# Patient Record
Sex: Female | Born: 1995 | Hispanic: No | Marital: Married
Health system: Northeastern US, Academic
[De-identification: ages and names within clinical notes are randomized; demographics above are authoritative.]

## PROBLEM LIST (undated history)

## (undated) DIAGNOSIS — J309 Allergic rhinitis, unspecified: Secondary | ICD-10-CM

## (undated) DIAGNOSIS — L309 Dermatitis, unspecified: Secondary | ICD-10-CM

## (undated) DIAGNOSIS — R4589 Other symptoms and signs involving emotional state: Secondary | ICD-10-CM

## (undated) HISTORY — DX: Dermatitis, unspecified: L30.9

## (undated) HISTORY — DX: Other symptoms and signs involving emotional state: R45.89

## (undated) HISTORY — DX: Allergic rhinitis, unspecified: J30.9

---
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
## 2013-03-26 ENCOUNTER — Ambulatory Visit (INDEPENDENT_AMBULATORY_CARE_PROVIDER_SITE_OTHER): Payer: Medicaid Other | Admitting: Family Medicine

## 2013-03-26 ENCOUNTER — Encounter: Payer: Self-pay | Admitting: Family Medicine

## 2013-03-26 VITALS — BP 124/86 | HR 98 | Temp 97.9°F | Ht 61.5 in | Wt 153.0 lb

## 2013-03-26 DIAGNOSIS — N912 Amenorrhea, unspecified: Secondary | ICD-10-CM

## 2013-03-26 DIAGNOSIS — E663 Overweight: Secondary | ICD-10-CM

## 2013-03-26 DIAGNOSIS — N926 Irregular menstruation, unspecified: Secondary | ICD-10-CM

## 2013-03-26 DIAGNOSIS — Z6825 Body mass index (BMI) 25.0-25.9, adult: Secondary | ICD-10-CM

## 2013-03-26 LAB — TSH: TSH: 1.905 u[IU]/mL (ref 0.350–4.500)

## 2013-03-26 LAB — FOLLICLE STIMULATING HORMONE: FSH: 6.6 m[IU]/mL

## 2013-03-26 LAB — PROLACTIN: Prolactin: 11.3 ng/mL

## 2013-03-26 NOTE — Patient Instructions (Addendum)
Secondary Amenorrhea   Secondary amenorrhea is the stopping of menstrual flow for 3 to 6 months in a female who has previously had periods. There are many possible causes. Most of these causes are not serious. Usually treating the underlying problem causing the loss of menses will return your periods to normal.  CAUSES   Some common and uncommon causes of not menstruating include:  · Malnutrition.  · Low blood sugar (hypoglycemia).  · Polycystic ovarian disease.  · Stress or fear.  · Breastfeeding.  · Hormone imbalance.  · Ovarian failure.  · Medications.  · Extreme obesity.  · Cystic fibrosis.  · Low body weight or drastic weight reduction from any cause.  · Early menopause.  · Removal of ovaries or uterus.  · Contraceptives.  · Illness.  · Long term (chronic) illnesses.  · Cushing's syndrome.  · Thyroid problems.  · Birth control pills, patches, or vaginal rings for birth control.  DIAGNOSIS   This diagnosis is made by your caregiver taking a medical history and doing a physical exam. Pregnancy must be ruled out. Often times, numerous blood tests of different hormones in the body may be measured. Urine testing may be done. Specialized x-rays may have to be done as well as measuring the body mass index (BMI).  TREATMENT   Treatment depends on the cause of the amenorrhea. If an eating disorder is present, this can be treated with an adequate diet and therapy. Chronic illnesses may improve with treatment of the illness. Overall, the outlook is good. The amenorrhea may be corrected with medications, lifestyle changes, or surgery. If the amenorrhea cannot be corrected, it is sometimes possible to create a false menstruation with medications.  Document Released: 01/21/2007 Document Revised: 03/03/2012 Document Reviewed: 11/28/2007  ExitCare® Patient Information ©2013 [employer].

## 2013-03-26 NOTE — Progress Notes (Signed)
Family Medicine Office Visit Note   Subjective:   Patient ID: Sierra Jennings, female  DOB: [birth year], 17 y.o.. MRN: [id number]   Pt that comes today with the complaint of a menorrhea. Her menarche happened in 2007 and since then she has been having irregular menses. Her periods are described to be normal flow and up to 7 days. The frequency for the past year was: January, Feb, March then June, July, August- December. She has had no period since then (4 months). She denies sexual activity.  He also has noticed increase in her weight, and increase hair distribution on her upper lip which patient shaves.  Patient denies abdominal pain or other symptoms.  Review of Systems:  Per history of present illness.  Objective:   Physical Exam: Gen:  NAD HEENT: Moist mucous membranes CV: Regular rate and rhythm, no murmurs rubs or gallops Breast: no galactorrhea.  PULM: Clear to auscultation bilaterally. No wheezes/rales/rhonchi ABD: Soft, non tender, non distended, normal bowel sounds EXT: No edema Neuro: Alert and oriented x3. No focalization SKIN: shaved upper lip, shave 2 pubic axillary hair. No rashes no istriae, acanthosis nigricans, vitiligo, and easy bruisability.  Assessment & Plan:

## 2013-03-26 NOTE — Assessment & Plan Note (Signed)
Increase of 9 pounds in 10 months. BMI and 28.4. Patient denies any eating disorder or eating any different that she was before.  Plan: Will discuss after results for amenorrhea workup is back. Given to patient general guidelines about healthy nutritional choices.

## 2013-03-26 NOTE — Assessment & Plan Note (Addendum)
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 Patient denies sexual activity. Physical exam positive for hirsutism and overweight. Plan: Urinary pregnancy test, and restiform oral test shown above. Followup in 2 weeks to discuss results.

## 2013-03-27 LAB — DHEA-SULFATE: DHEA-SO4: 393 ug/dL (ref 35–430)

## 2013-07-16 ENCOUNTER — Ambulatory Visit: Payer: Self-pay

## 2014-06-04 ENCOUNTER — Ambulatory Visit (INDEPENDENT_AMBULATORY_CARE_PROVIDER_SITE_OTHER): Payer: Medicaid Other | Admitting: Family Medicine

## 2014-06-04 ENCOUNTER — Encounter: Payer: Self-pay | Admitting: Family Medicine

## 2014-06-04 VITALS — BP 114/77 | HR 111 | Temp 98.7°F | Wt 150.0 lb

## 2014-06-04 DIAGNOSIS — A084 Viral intestinal infection, unspecified: Secondary | ICD-10-CM | POA: Insufficient documentation

## 2014-06-04 DIAGNOSIS — A088 Other specified intestinal infections: Secondary | ICD-10-CM

## 2014-06-04 NOTE — Assessment & Plan Note (Signed)
A:  Gastroenteritis  Resolved except for mild sore throat and headache lingering.  Afebrile.  P:   Supportive care with tylenol, motrin, fluids.

## 2014-06-04 NOTE — Progress Notes (Signed)
Patient ID: Sierra Jennings, female   DOB: 1996-07-31, 18 y.o.   MRN: [id number] Subjective:     Sierra Hammanbiha Rico is a 18 y.o. female who presents for evaluation of nausea and vomiting. Onset of symptoms was 3 days ago. Her family was visiting [place] in [place].  Patient describes nausea as severe. Vomiting has occurred several times over the past 2 days. Vomitus is described as normal gastric contents. Symptoms have been associated with fever to 102, sore throat, headache and  moderate abdominal pain. Patient denies alcohol overuse, hematemesis, melena and possibility of pregnancy. Symptoms have gradually improved. Her 20 yo sister started with similar symptoms today. Evaluation to date has been none. Treatment to date has been tylenol and motrin at home. Last took antipyretic last night. Last emesis yesterday during the day. .   Soc hx: non smoker.  Review of Systems Pertinent items are noted in HPI.   Objective:    BP 114/77  Pulse 111  Temp(Src) 98.7 F (37.1 C) (Oral)  Wt 150 lb (68.04 kg)  LMP 03/04/2014 General appearance: alert, cooperative and no distress Head: Normocephalic, without obvious abnormality, atraumatic Eyes: conjunctivae/corneas clear. PERRL, EOM's intact.  Ears: normal TM's and external ear canals both ears Nose: Nares normal. Septum midline. Mucosa normal. No drainage or sinus tenderness. Throat: lips, mucosa, and tongue normal; teeth and gums normal Lungs: clear to auscultation bilaterally Heart: regular rate and rhythm, S1, S2 normal, no murmur, click, rub or gallop Abdomen: soft, non-tender; bowel sounds normal; no masses,  no organomegaly Skin: Skin color, texture, turgor normal. No rashes or lesions   Assessment:    Gastroenteritis  Resolved except for mild sore throat and headache lingering.  Afebrile.   Plan:      Supportive care with tylenol, motrin, fluids.

## 2014-06-04 NOTE — Patient Instructions (Signed)
November,  Thank you for coming in today. You had what sounds like a viral GI illness which is getting better. Continue to drink plenty of fluids, advance your diet to regular foods slowly. If fever or vomiting recurs start tylenol and motrin. Call in for antiemetic (nause medication).   Dr. Armen PickupFunches   Viral Gastroenteritis Viral gastroenteritis is also called stomach flu. This illness is caused by a certain type of germ (virus). It can cause sudden watery poop (diarrhea) and throwing up (vomiting). This can cause you to lose body fluids (dehydration). This illness usually lasts for 3 to 8 days. It usually goes away on its own. HOME CARE   Drink enough fluids to keep your pee (urine) clear or pale yellow. Drink small amounts of fluids often.  Ask your doctor how to replace body fluid losses (rehydration).  Avoid:  Foods high in sugar.  Alcohol.  Bubbly (carbonated) drinks.  Tobacco.  Juice.  Caffeine drinks.  Very hot or cold fluids.  Fatty, greasy foods.  Eating too much at one time.  Dairy products until 24 to 48 hours after your watery poop stops.  You may eat foods with active cultures (probiotics). They can be found in some yogurts and supplements.  Wash your hands well to avoid spreading the illness.  Only take medicines as told by your doctor. Do not give aspirin to children. Do not take medicines for watery poop (antidiarrheals).  Ask your doctor if you should keep taking your regular medicines.  Keep all doctor visits as told. GET HELP RIGHT AWAY IF:   You cannot keep fluids down.  You do not pee at least once every 6 to 8 hours.  You are short of breath.  You see blood in your poop or throw up. This may look like coffee grounds.  You have belly (abdominal) pain that gets worse or is just in one small spot (localized).  You keep throwing up or having watery poop.  You have a fever.  The patient is a child younger than 3 months, and he or she has a  fever.  The patient is a child older than 3 months, and he or she has a fever and problems that do not go away.  The patient is a child older than 3 months, and he or she has a fever and problems that suddenly get worse.  The patient is a baby, and he or she has no tears when crying. MAKE SURE YOU:   Understand these instructions.  Will watch your condition.  Will get help right away if you are not doing well or get worse. Document Released: 05/28/2008 Document Revised: 03/03/2012 Document Reviewed: 09/26/2011 [facility] Patient Information 2014 [place].

## 2015-03-28 ENCOUNTER — Ambulatory Visit (INDEPENDENT_AMBULATORY_CARE_PROVIDER_SITE_OTHER): Payer: Medicaid Other | Admitting: Family Medicine

## 2015-03-28 ENCOUNTER — Encounter: Payer: Self-pay | Admitting: Family Medicine

## 2015-03-28 VITALS — BP 107/72 | HR 81 | Temp 97.8°F | Ht 61.56 in | Wt 151.7 lb

## 2015-03-28 DIAGNOSIS — K130 Diseases of lips: Secondary | ICD-10-CM | POA: Insufficient documentation

## 2015-03-28 DIAGNOSIS — K1379 Other lesions of oral mucosa: Secondary | ICD-10-CM

## 2015-03-28 NOTE — Assessment & Plan Note (Signed)
Small amount of material expressed today. Expect this will resolve without further intervention. advised pt f/u if worsening or not improving in next 1-2 mos.

## 2015-03-28 NOTE — Patient Instructions (Signed)
  Hopefully the mucocele will go away on its own We got some of the mucous out today This is a benign issue which should get better with time  Return if worsening Be well, Dr. Pollie MeyerMcIntyre

## 2015-03-28 NOTE — Progress Notes (Signed)
Patient ID: Sierra Jennings, female   DOB: 12-03-1996, 19 y.o.   MRN: [id number]  HPI:  Pt presents for a same day appointment to discuss bump on bottom lip.  Has had this for about 2 months. Has been getting bigger, occasionally shrinks in size a little. No trauma preceding the bump. Has not drained pus or blood. No other bumps elsewhere in her body. Does not really hurt at all. Does occasionally get in the way when she chews it. Eating and drinking normally.   ROS: See HPI  PMFSH: irregular menses, eczema, overweight. Pt denies any hx of congenital heart disease or need for antibiotic prophylaxis prior to dental procedures.  PHYSICAL EXAM: BP 107/72 mmHg  Pulse 81  Temp(Src) 97.8 F (36.6 C) (Oral)  Ht 5' 1.56" (1.564 m)  Wt 151 lb 11.2 oz (68.811 kg)  BMI 28.13 kg/m2  LMP 03/07/2015 Gen: NAD HEENT: NCAT, MMM, oropharynx clear and moist. Lower L lip with 0.5cm mucocele on inner aspect of lip. Soft, mildly fluctuant, nontender to palpation. No other oral lesions seen.   PROCEDURE NOTE: After informed consent was contained, a small 30g needle was used to puncture mucocele on L lower lip. Small amount of slightly sanguinous mucous material spontaneously expressed, with additional mucous material following with squeezing of mucocele. No complications, patient tolerated the procedure well.  ASSESSMENT/PLAN:  Mucocele of lower lip Small amount of material expressed today. Expect this will resolve without further intervention. advised pt f/u if worsening or not improving in next 1-2 mos.    FOLLOW UP: F/u as needed if symptoms worsen or do not improve.   [place]renadaBrittany J. Pollie MeyerMcIntyre, MD [facility]

## 2015-03-30 NOTE — Progress Notes (Signed)
I was available as preceptor to resident for this patient's office visit.  

## 2016-03-26 ENCOUNTER — Encounter: Payer: Medicaid Other | Admitting: Family Medicine

## 2016-04-13 ENCOUNTER — Encounter: Payer: Medicaid Other | Admitting: Family Medicine

## 2016-04-27 ENCOUNTER — Encounter: Payer: Medicaid Other | Admitting: Family Medicine

## 2016-12-19 NOTE — Progress Notes (Signed)
Subjective:  Sierra Jennings is a 20 y.o. year old female who presents to office today for an annual physical examination.  Concerns today include:   1. Sneezes: Has been sneezing "for years". It is intermittent. She also has runny nose. Has never had sinus infections. Can happen at any season. Admits to watery and itchy eyes. Knows that she is allergic to pollen. Denies any fevers, chills, shortness of breath, wheezing, chest pain.  2. Eczema: Patient states that she has eczema. She has had eczema for many years now. She uses her father's cream for eczema but does not have the name of it. She notes that her eczema usually goes away when she uses this cream that she has not used it recently. Her eczema spots are usually itchy and sometimes bleed from itching him some much.  Review of Systems Pertinent items noted in HPI and remainder of comprehensive ROS otherwise negative.    General Healthcare: Medication Compliance: Does not take any medications  Dx Hypertension: No Dx Hyperlipidemia: No Diabetes: No Dx Obesity: No Weight Loss: No Physical Activity: Started going to the gym about 3-4 times a week Urinary Incontinence: No  Social:  reports that she has never smoked. She does not have any smokeless tobacco history on file. Driving: She does drive a car and wear a seatbelt Alcohol Use: No Tobacco: No  Other Drugs: No  Support and Life at Home: Good support, lives with parents and 3 siblings  Advanced Directives: No  Cancer:  Colorectal >> Colonoscopy: N/A Lung >> Tobacco Use: None  Breast >> Mammogram: N/A Cervical/Endometrial >>  - Postmenopausal: No - Hysterectomy: No - Vaginal Bleeding: No Skin >> Suspicious lesions: no  Other: Osteoporosis: No  Zoster Vaccine: No Pneumonia Vaccine: No  Is not sexually active  Gets regular periods, gets a period every month. LMP was 11/30.   Past Medical History Patient Active Problem List   Diagnosis Date Noted  . Encounter  for wellness examination in adult 12/21/2016  . Overweight (BMI 25.0-29.9) 03/26/2013  . ALLERGIC RHINITIS 05/20/2008  . ECZEMA 03/28/2007    Medications- reviewed and updated Current Outpatient Prescriptions  Medication Sig Dispense Refill  . cetirizine (ZYRTEC) 10 MG tablet Take 1 tablet (10 mg total) by mouth daily. Take regularly to prevent allergy symptoms. 30 tablet 5  . triamcinolone cream (KENALOG) 0.1 % Apply 1 application topically 2 (two) times daily. 30 g 5   No current facility-administered medications for this visit.     Objective: BP 96/60   Pulse 80   Temp 97.9 F (36.6 C) (Oral)   Ht 5\' 2"  (1.575 m)   Wt 150 lb 3.2 oz (68.1 kg)   LMP 11/22/2016 (Exact Date)   SpO2 98%   BMI 27.47 kg/m  Gen: In no acute distress, alert, cooperative with exam HEENT: NCAT, EOMI, PERRL CV: Regular rate and rhythm, normal S1/S2, no murmur Resp: Clear to auscultation bilaterally, no wheezes, non-labored Abd: Soft, Non Tender, Non Distended, bowel sounds present, no guarding or organomegaly Ext: No edema, warm and well perfused Skin: Dry erythematous papular rash on flexor surfaces of bilateral arms as well as right neck Neuro: Alert and oriented, No gross deficits, normal gait  Assessment/Plan:  ECZEMA Uncontrolled. Rash present on flexor surfaces of bilateral arms as well as right-sided neck. Discussed basic skin care for eczema. Prescription for triamcinolone cream given to patient. Follow-up as needed.  ALLERGIC RHINITIS Uncontrolled symptoms. Prescription given to patient for Zyrtec 10 mg daily. Will  return to clinic in Zyrtec does not improve symptoms.   Encounter for wellness examination in adult Updated all medical history in Epic. Patient prefers not to have Pap smear. Does not want any vaccines at this time. We'll do basic lab work today per patient preference and follow-up in one year or sooner if needed.   Orders Placed This Encounter  Procedures  . CBC with  Differential  . TSH  . COMPLETE METABOLIC PANEL WITH GFR    Meds ordered this encounter  Medications  . triamcinolone cream (KENALOG) 0.1 %    Sig: Apply 1 application topically 2 (two) times daily.    Dispense:  30 g    Refill:  5  . DISCONTD: cetirizine (ZYRTEC) 10 MG tablet    Sig: Take 1 tablet (10 mg total) by mouth daily. Take regularly to prevent allergy symptoms.    Dispense:  30 tablet    Refill:  5  . cetirizine (ZYRTEC) 10 MG tablet    Sig: Take 1 tablet (10 mg total) by mouth daily. Take regularly to prevent allergy symptoms.    Dispense:  30 tablet    Refill:  5     Anders Simmondshristina Gambino, MD [facility], PGY-2

## 2016-12-21 ENCOUNTER — Encounter: Payer: Self-pay | Admitting: Family Medicine

## 2016-12-21 ENCOUNTER — Ambulatory Visit (INDEPENDENT_AMBULATORY_CARE_PROVIDER_SITE_OTHER): Payer: Medicaid Other | Admitting: Family Medicine

## 2016-12-21 VITALS — BP 96/60 | HR 80 | Temp 97.9°F | Ht 62.0 in | Wt 150.2 lb

## 2016-12-21 DIAGNOSIS — Z0000 Encounter for general adult medical examination without abnormal findings: Secondary | ICD-10-CM | POA: Insufficient documentation

## 2016-12-21 LAB — COMPLETE METABOLIC PANEL WITH GFR
ALBUMIN: 4.4 g/dL (ref 3.6–5.1)
ALT: 16 U/L (ref 6–29)
AST: 19 U/L (ref 10–30)
Alkaline Phosphatase: 84 U/L (ref 33–115)
BUN: 9 mg/dL (ref 7–25)
CHLORIDE: 104 mmol/L (ref 98–110)
CO2: 27 mmol/L (ref 20–31)
Calcium: 9.6 mg/dL (ref 8.6–10.2)
Creat: 0.63 mg/dL (ref 0.50–1.10)
GFR, Est African American: >89 mL/min (ref >=60)
GFR, Est Non African American: >89 mL/min (ref >=60)
GLUCOSE: 77 mg/dL (ref 65–99)
POTASSIUM: 4.4 mmol/L (ref 3.5–5.3)
SODIUM: 140 mmol/L (ref 135–146)
Total Bilirubin: 0.7 mg/dL (ref 0.2–1.2)
Total Protein: 7.5 g/dL (ref 6.1–8.1)

## 2016-12-21 LAB — CBC WITH DIFFERENTIAL/PLATELET
BASOS PCT: 1 %
Basophils Absolute: 63 cells/uL (ref 0–200)
Eosinophils Absolute: 441 cells/uL (ref 15–500)
Eosinophils Relative: 7 %
HEMATOCRIT: 41.5 % (ref 35.0–45.0)
Hemoglobin: 13.8 g/dL (ref 11.7–15.5)
LYMPHS ABS: 1260 {cells}/uL (ref 850–3900)
LYMPHS PCT: 20 %
MCH: 31 pg (ref 27.0–33.0)
MCHC: 33.3 g/dL (ref 32.0–36.0)
MCV: 93.3 fL (ref 80.0–100.0)
MONO ABS: 441 {cells}/uL (ref 200–950)
MPV: 11.1 fL (ref 7.5–12.5)
Monocytes Relative: 7 %
NEUTROS ABS: 4095 {cells}/uL (ref 1500–7800)
Neutrophils Relative %: 65 %
PLATELETS: 292 10*3/uL (ref 140–400)
RBC: 4.45 MIL/uL (ref 3.80–5.10)
RDW: 12.9 % (ref 11.0–15.0)
WBC: 6.3 10*3/uL (ref 3.8–10.8)

## 2016-12-21 MED ORDER — CETIRIZINE HCL 10 MG PO TABS: 10.0000 mg | ORAL_TABLET | Freq: Every day | ORAL | 5 refills | Status: DC

## 2016-12-21 MED ORDER — CETIRIZINE HCL 10 MG PO TABS: 10.0000 mg | ORAL_TABLET | Freq: Every day | ORAL | 5 refills | Status: AC

## 2016-12-21 MED ORDER — TRIAMCINOLONE ACETONIDE 0.1 % EX CREA: 1.0000 "application " | TOPICAL_CREAM | Freq: Two times a day (BID) | CUTANEOUS | 5 refills | Status: AC

## 2016-12-21 NOTE — Assessment & Plan Note (Signed)
Uncontrolled. Rash present on flexor surfaces of bilateral arms as well as right-sided neck. Discussed basic skin care for eczema. Prescription for triamcinolone cream given to patient. Follow-up as needed.

## 2016-12-21 NOTE — Assessment & Plan Note (Signed)
Uncontrolled symptoms. Prescription given to patient for Zyrtec 10 mg daily. Will return to clinic in Zyrtec does not improve symptoms.

## 2016-12-21 NOTE — Patient Instructions (Signed)
Thank you for coming in today, it was so nice to see you! Today we talked about:    Sneezing: I think you have seasonal allergies. Please take zyrtec every day  Eczema: Please use the cream as prescribed.  Please follow up in 1 year or sooner if needed.   If we ordered any tests today, you will be notified via telephone of any abnormalities. If everything is normal you will get a letter in the mail.   If you have any questions or concerns, please do not hesitate to call the office at [phone]. You can also message me directly via MyChart.   Sincerely,  Anders Simmondshristina Welton Bord, MD   Basic Skin Care Your child's skin plays an important role in keeping the entire body healthy.  Below are some tips on how to try and maximize skin health from the outside in.  1) Bathe in mildly warm water every 1 to 3 days, followed by light drying and an application of a thick moisturizer cream or ointment, preferably one that comes in a tub. a. Fragrance free moisturizing bars or body washes are preferred such as Purpose, Cetaphil, Dove sensitive skin, Aveeno, [employer] Baby or Vanicream products. b. Use a fragrance free cream or ointment, not a lotion, such as plain petroleum jelly or Vaseline ointment, Aquaphor, Vanicream, Eucerin cream or a generic version, CeraVe Cream, Cetaphil Restoraderm, Aveeno Eczema Therapy and [employer] Baby Calming, among others. c. Children with very dry skin often need to put on these creams two, three or four times a day.  As much as possible, use these creams enough to keep the skin from looking dry. d. Consider using fragrance free/dye free detergent, such as Arm and Hammer for sensitive skin, Tide Free or All Free.   2) If I am prescribing a medication to go on the skin, the medicine goes on first to the areas that need it, followed by a thick cream as above to the entire body.  3) Wynelle LinkSun is a major cause of damage to the skin. a. I recommend sun protection for all of my  patients. I prefer physical barriers such as hats with wide brims that cover the ears, long sleeve clothing with SPF protection including rash guards for swimming. These can be found seasonally at outdoor clothing companies, [employer] and [employer] and online at Liz Claibornewww.coolibar.com, www.uvskinz.com and BrideEmporium.nlwww.sunprecautions.com. Avoid peak sun between the hours of 10am to 3pm to minimize sun exposure.  b. I recommend sunscreen for all of my patients older than 126 months of age when in the sun, preferably with broad spectrum coverage and SPF 30 or higher.  i. For children, I recommend sunscreens that only contain titanium dioxide and/or zinc oxide in the active ingredients. These do not burn the eyes and appear to be safer than chemical sunscreens. These sunscreens include zinc oxide paste found in the diaper section, Vanicream Broad Spectrum 50+, Aveeno Natural Mineral Protection, Neutrogena Pure and Free Baby, Johnson and MotorolaJohnson Baby Daily face and body lotion, Cit[employer] Baby products, among others. ii. There is no such thing as waterproof sunscreen. All sunscreens should be reapplied after 60-80 minutes of wear.  iii. Spray on sunscreens often use chemical sunscreens which do protect against the sun. However, these can be difficult to apply correctly, especially if wind is present, and can be more likely to irritate the skin.  Long term effects of chemical sunscreens are also not fully known.

## 2016-12-21 NOTE — Assessment & Plan Note (Signed)
Updated all medical history in Epic. Patient prefers not to have Pap smear. Does not want any vaccines at this time. We'll do basic lab work today per patient preference and follow-up in one year or sooner if needed.

## 2016-12-22 ENCOUNTER — Encounter: Payer: Self-pay | Admitting: Family Medicine

## 2016-12-22 LAB — TSH: TSH: 1.05 mIU/L

## 2016-12-23 ENCOUNTER — Encounter: Payer: Self-pay | Admitting: Family Medicine

## 2017-06-17 ENCOUNTER — Ambulatory Visit: Payer: Medicaid Other

## 2018-04-09 ENCOUNTER — Ambulatory Visit: Payer: Self-pay

## 2018-10-10 ENCOUNTER — Ambulatory Visit (INDEPENDENT_AMBULATORY_CARE_PROVIDER_SITE_OTHER): Payer: Self-pay | Admitting: Family Medicine

## 2018-10-10 ENCOUNTER — Other Ambulatory Visit: Payer: Self-pay

## 2018-10-10 ENCOUNTER — Telehealth: Payer: Self-pay | Admitting: *Deleted

## 2018-10-10 VITALS — BP 98/72 | HR 74 | Temp 98.8°F | Ht 62.0 in | Wt 164.0 lb

## 2018-10-10 DIAGNOSIS — N644 Mastodynia: Secondary | ICD-10-CM

## 2018-10-10 NOTE — Progress Notes (Signed)
   Subjective:   Patient ID: Sierra Jennings    DOB: [birth year], 22 y.o. female   MRN: [id number]  CC: breast pain  HPI: Sierra Jennings is a 22 y.o. female who presents to clinic today for the following issue.  Breast pain Patient reports pain in bilateral breasts over the last 2 to 3 years, usually right side more often than the left.  She reports small white bumps that pop up and bleed with small amount of white fluid coming out of them.  They usually subside on their own.  She has not tried any medications or treatment for this.  There is no correlation with her period.  No fever, chills, nausea, vomiting.  No nipple fluid or discharge.  No family history of breast cancer.  She is nulliparous and not sexually active.  ROS: No fever, chills, nausea, vomiting.  No chest pain, shortness of breath.  Social: pt is a never smoker.  Medications reviewed. Objective:   BP 98/72   Pulse 74   Temp 98.8 F (37.1 C) (Oral)   Ht 5\' 2"  (1.575 m)   Wt 164 lb (74.4 kg)   LMP 09/24/2018 (Approximate)   SpO2 99%   BMI 30.00 kg/m  Vitals and nursing note reviewed.  General: 22 year old female, NAD Neck: supple CV: RRR no MRG  Lungs: CTAB, normal effort  Abdomen: soft, +bs  Breasts: breasts appear normal, no suspicious masses, no skin or nipple changes or axillary nodes bilaterally.  Skin: warm, dry, no rash  Assessment & Plan:   Breast pain, right Normal breast exam today without evidence of cyst or nodes.  No red flags.  No family history of breast cancer. Reassurance provided, recommend that she take a photograph next time this occurs to bring and show provider to better delineate what this may be.  Patient desires diagnostic mammogram of  breast for further evaluation, have placed order for this.  Orders Placed This Encounter  Procedures  . MM Digital Diagnostic Unilat R    Standing Status:   Future    Standing Expiration Date:   12/11/2019    Order Specific Question:   Reason for Exam  (SYMPTOM  OR DIAGNOSIS REQUIRED)    Answer:   breast pain with discharge    Order Specific Question:   Is the patient pregnant?    Answer:   No    Order Specific Question:   Preferred imaging location?    Answer:   [facility]   Freddrick March, MD [facility], PGY-3

## 2018-10-10 NOTE — Patient Instructions (Signed)
It was nice meeting you today.  You were seen in clinic for right breast pain.  As we discussed, your breast exam today was normal.  It may help to take photographs of the lumps with fluid when they occur to bring in and show your provider.  I am sending you for a diagnostic right breast exam and will call you once the results come back.  Please call clinic if you have any questions.  Freddrick March MD

## 2018-10-10 NOTE — Assessment & Plan Note (Signed)
Normal breast exam today without evidence of cyst or nodes.  No red flags.  No family history of breast cancer. Reassurance provided, recommend that she take a photograph next time this occurs to bring and show provider to better delineate what this may be.  Patient desires diagnostic mammogram of  breast for further evaluation, have placed order for this.

## 2018-10-10 NOTE — Telephone Encounter (Signed)
Contacted [facility] to schedule diagnostic mammogram and was told that pt insurance would not cover this.  Was given number to speak to someone about getting this pt a diagnostic mammo through the scholarship program.  Called and left a message for someone to contact me to find out what our next steps are to get this completed.Lamonte Sakai, Dagen Beevers D, [place]

## 2019-06-19 ENCOUNTER — Ambulatory Visit (INDEPENDENT_AMBULATORY_CARE_PROVIDER_SITE_OTHER): Payer: Self-pay | Admitting: Family Medicine

## 2019-06-19 ENCOUNTER — Encounter: Payer: Self-pay | Admitting: Family Medicine

## 2019-06-19 ENCOUNTER — Other Ambulatory Visit: Payer: Self-pay

## 2019-06-19 VITALS — BP 100/75 | HR 72

## 2019-06-19 DIAGNOSIS — Z0000 Encounter for general adult medical examination without abnormal findings: Secondary | ICD-10-CM

## 2019-06-19 DIAGNOSIS — R4589 Other symptoms and signs involving emotional state: Secondary | ICD-10-CM | POA: Insufficient documentation

## 2019-06-19 DIAGNOSIS — Z113 Encounter for screening for infections with a predominantly sexual mode of transmission: Secondary | ICD-10-CM

## 2019-06-19 DIAGNOSIS — F329 Major depressive disorder, single episode, unspecified: Secondary | ICD-10-CM

## 2019-06-19 NOTE — Patient Instructions (Addendum)
   It was wonderful to see you today.  Thank you for choosing [facility].   Please call [phone] with any questions about today's appointment.  Please be sure to schedule follow up at the front  desk before you leave today.   Dorris Singh, MD  Family Medicine    You will receive a call from Ms. Laurance Flatten (Education officer, museum) about options for therapy  Below is the number for genetics counseling: regarding your concerns  [phone]---they can connect you with genetic counseling, if you need a referral, please call our clinic  Good luck with studying!!!
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
## 2019-06-20 LAB — HCV COMMENT:

## 2019-06-20 LAB — HEPATITIS C ANTIBODY (REFLEX): HCV Ab: <0.1 s/co ratio (ref 0.0–0.9)

## 2019-06-20 LAB — HIV ANTIBODY (ROUTINE TESTING W REFLEX): HIV Screen 4th Generation wRfx: NONREACTIVE

## 2019-06-22 ENCOUNTER — Telehealth: Payer: Self-pay | Admitting: Family Medicine

## 2019-06-22 ENCOUNTER — Encounter: Payer: Self-pay | Admitting: Family Medicine

## 2019-06-22 NOTE — Telephone Encounter (Signed)
Pt returned call.  Informed. Christen Bame, CMA

## 2019-06-22 NOTE — Telephone Encounter (Signed)
Called patient, left generic voicemail to call back.  Results returned all normal--this is good news.   Dorris Singh, MD  Family Medicine Teaching Service

## 2019-06-22 NOTE — Progress Notes (Signed)
Sent normal letter results.

## 2019-06-25 ENCOUNTER — Telehealth: Payer: Self-pay | Admitting: Licensed Clinical Social Worker

## 2019-06-25 NOTE — Telephone Encounter (Signed)
    Phone Outreach Note  06/25/2019 Name: Sierra Jennings MRN: [id number] DOB: 1996-08-08  Referred by: Martyn Malay, MD Reason for referral : Care Coordination (therapy )  telephone outreach to patient reference the above referral. Phone appointment scheduled July 7th at 3:00 for assessment and plan.  Casimer Lanius, LCSW [facility]   [phone] 10:16 AM

## 2019-06-30 ENCOUNTER — Telehealth: Payer: Self-pay | Admitting: Licensed Clinical Social Worker

## 2019-06-30 ENCOUNTER — Institutional Professional Consult (permissible substitution): Payer: Medicaid Other

## 2019-06-30 ENCOUNTER — Other Ambulatory Visit: Payer: Self-pay

## 2019-06-30 NOTE — Telephone Encounter (Signed)
   Unsuccessful Phone Appointment  06/30/2019 Name: Sierra Jennings MRN: [id number] DOB: [birth year]  Referred by: Martyn Malay, MD Reason for referral : Care Coordination (counseling resources)  Phone appointment scheduled with patient to provide and review counseling resource options.  No answer. An unsuccessful telephone outreach to patient was attempted today reference the above referral.  Left voice message to call LCSW it patient would like to review and discuss options  Follow Up Plan:  LCSW will wait for return call.  Casimer Lanius, [place]   [phone] 3:16 PM

## 2019-08-25 ENCOUNTER — Other Ambulatory Visit: Payer: Self-pay | Admitting: Emergency Medicine

## 2019-08-25 DIAGNOSIS — Z20822 Contact with and (suspected) exposure to covid-19: Secondary | ICD-10-CM

## 2019-08-27 LAB — NOVEL CORONAVIRUS, NAA: SARS-CoV-2, NAA: NOT DETECTED

## 2020-06-10 ENCOUNTER — Ambulatory Visit (INDEPENDENT_AMBULATORY_CARE_PROVIDER_SITE_OTHER): Payer: No Typology Code available for payment source | Admitting: Family Medicine

## 2020-06-10 ENCOUNTER — Other Ambulatory Visit: Payer: Self-pay

## 2020-06-10 ENCOUNTER — Ambulatory Visit: Payer: Medicaid Other | Admitting: Family Medicine

## 2020-06-10 ENCOUNTER — Encounter: Payer: Self-pay | Admitting: Family Medicine

## 2020-06-10 VITALS — BP 110/68 | HR 80 | Wt 187.0 lb

## 2020-06-10 DIAGNOSIS — N926 Irregular menstruation, unspecified: Secondary | ICD-10-CM

## 2020-06-10 LAB — POCT GLYCOSYLATED HEMOGLOBIN (HGB A1C): Hemoglobin A1C: 5.2 % (ref 4.0–5.6)

## 2020-06-10 MED ORDER — NORETHINDRONE 0.35 MG PO TABS: 1.0000 | ORAL_TABLET | Freq: Every day | ORAL | 11 refills | Status: DC

## 2020-06-10 NOTE — Progress Notes (Signed)
    SUBJECTIVE:   CHIEF COMPLAINT / HPI:   Irregular periods.  Always typically irregular, but mostly 30-35 days.  When she does bleed, usually does not have excessive bleeding. Excpet for three weeks ago had really heavy bleeding. Wears thick pads. Last time, bled through pad, underwear and pants. The time before that, had for twenty days.  Has also noticed weight gain.  Before getting married 155 and now is 180.  She tracks them on an app on her phone.  Most bothersome to her is the irregularity.  She does not plan on conceiving at this time. Is not against using birth control to help regulate her periods.   Dyspareunia that resolves after the first few minutes of intercourse.   PERTINENT  PMH / PSH: Hx of irregular periods since menarche   OBJECTIVE:   BP 110/68   Pulse 80   Wt 187 lb (84.8 kg)   LMP 05/17/2020 (Approximate)   SpO2 98%   BMI 34.20 kg/m   General: well appearing, NAD.  Mild pustular acne on cheeks and chin. No facial hair growth.  Pelvic exam: deferred  Husband present during exam today.   ASSESSMENT/PLAN:   Irregular periods Patient returning with complaints of irregular menses.  She has been seen for this issue multiple times previously. Given patient's obesity, this is likely adding to her issue. Recommend weight loss which patient is agreeable to. She has never had a pelvic exam previously due to pain and declines pelvic exam/bimanual exam today. I don't think she would tolerate a TVUS at this time. Menstrual irregularity, obesity and acne suspicious for PCOS. Will manage with POPs to see if this helps to regulate her menses. No history of blood clots, non-smoker. Patient instructed to return if she continues to have issues with dyspareunia. Follow up in 3 months. Also obtaining CBC and other metabolic labs today.  Patient should also take PNV given her age. If she plans on conceiving, she should let her medical providers know. She will continue to follow  with [place] at [facility] for OB/GYN care as her last OB/GYN was in [place]. Requesting records.    Melene Plan, MD [facility]

## 2020-06-10 NOTE — Patient Instructions (Addendum)
I am going to start a medication called Micronor.  This is a progesterone only birth control pill.  The purpose of this would be to help regulate your periods so that you are not having very heavy cycles or long periods between your cycles.  This medication will not help with menstrual cycle regularity immediately.  Typically, we anticipate about 3 to 6 months of menstrual irregularity prior to consistent cycles.  Please follow-up with [place] in about 3 months to see how you are doing.  I do have a strong suspicion for PCOS.  I have attached more information about this below.  In the meantime, I would like you to work on nutrition and exercise to help with weight loss.  This could certainly help with the irregular menstrual cycles.   Polycystic Ovarian Syndrome  Polycystic ovarian syndrome (PCOS) is a common hormonal disorder among women of reproductive age. In most women with PCOS, many small fluid-filled sacs (cysts) grow on the ovaries, and the cysts are not part of a normal menstrual cycle. PCOS can cause problems with your menstrual periods and make it difficult to get pregnant. It can also cause an increased risk of miscarriage with pregnancy. If it is not treated, PCOS can lead to serious health problems, such as diabetes and heart disease. What are the causes? The cause of PCOS is not known, but it may be the result of a combination of certain factors, such as:  Irregular menstrual cycle.  High levels of certain hormones (androgens).  Problems with the hormone that helps to control blood sugar (insulin resistance).  Certain genes. What increases the risk? This condition is more likely to develop in women who have a family history of PCOS. What are the signs or symptoms? Symptoms of PCOS may include:  Multiple ovarian cysts.  Infrequent periods or no periods.  Periods that are too frequent or too heavy.  Unpredictable periods.  Inability to get pregnant (infertility) because of not  ovulating.  Increased growth of hair on the face, chest, stomach, back, thumbs, thighs, or toes.  Acne or oily skin. Acne may develop during adulthood, and it may not respond to treatment.  Pelvic pain.  Weight gain or obesity.  Patches of thickened and dark brown or black skin on the neck, arms, breasts, or thighs (acanthosis nigricans).  Excess hair growth on the face, chest, abdomen, or upper thighs (hirsutism). How is this diagnosed? This condition is diagnosed based on:  Your medical history.  A physical exam, including a pelvic exam. Your health care provider may look for areas of increased hair growth on your skin.  Tests, such as: ? Ultrasound. This may be used to examine the ovaries and the lining of the uterus (endometrium) for cysts. ? Blood tests. These may be used to check levels of sugar (glucose), female hormone (testosterone), and female hormones (estrogen and progesterone) in your blood. How is this treated? There is no cure for PCOS, but treatment can help to manage symptoms and prevent more health problems from developing. Treatment varies depending on:  Your symptoms.  Whether you want to have a baby or whether you need birth control (contraception). Treatment may include nutrition and lifestyle changes along with:  Progesterone hormone to start a menstrual period.  Birth control pills to help you have regular menstrual periods.  Medicines to make you ovulate, if you want to get pregnant.  Medicine to reduce excessive hair growth.  Surgery, in severe cases. This may involve making small holes  in one or both of your ovaries. This decreases the amount of testosterone that your body produces. Follow these instructions at home:  Take over-the-counter and prescription medicines only as told by your health care provider.  Follow a healthy meal plan. This can help you reduce the effects of PCOS. ? Eat a healthy diet that includes lean proteins, complex  carbohydrates, fresh fruits and vegetables, low-fat dairy products, and healthy fats. Make sure to eat enough fiber.  If you are overweight, lose weight as told by your health care provider. ? Losing 10% of your body weight may improve symptoms. ? Your health care provider can determine how much weight loss is best for you and can help you lose weight safely.  Keep all follow-up visits as told by your health care provider. This is important. Contact a health care provider if:  Your symptoms do not get better with medicine.  You develop new symptoms. This information is not intended to replace advice given to you by your health care provider. Make sure you discuss any questions you have with your health care provider. Document Revised: 11/22/2017 Document Reviewed: 05/27/2016 Elsevier Patient Education  2020 [employer] for Polycystic Ovary Syndrome Polycystic ovary syndrome (PCOS) is a disorder of the chemicals (hormones) that regulate a woman's reproductive system, including monthly periods (menstruation). The condition causes important hormones to be out of balance. PCOS can:  Stop your periods or make them irregular.  Cause cysts to develop on your ovaries.  Make it difficult to get pregnant.  Stop your body from responding to the effects of insulin (insulin resistance). Insulin resistance can lead to obesity and diabetes. Changing what you eat can help you manage PCOS and improve your health. Following a balanced diet can help you lose weight and improve the way that your body uses insulin. What are tips for following this plan?  Follow a balanced diet for meals and snacks. Eat breakfast, lunch, dinner, and one or two snacks every day.  Include protein in each meal and snack.  Choose whole grains instead of products that are made with refined flour.  Eat a variety of foods.  Exercise regularly as told by your health care provider. Aim to do 30 or more minutes of  exercise on most days of the week.  If you are overweight or obese: ? Pay attention to how many calories you eat. Cutting down on calories can help you lose weight. ? Work with your health care provider or a diet and nutrition specialist (dietitian) to figure out how many calories you need each day. What foods can I eat?  Fruits Include a variety of colors and types. All fruits are helpful for PCOS. Vegetables Include a variety of colors and types. All vegetables are helpful for PCOS. Grains Whole grains, such as whole wheat. Whole-grain breads, crackers, cereals, and pasta. Unsweetened oatmeal, bulgur, barley, quinoa, and brown rice. Tortillas made from corn or whole-wheat flour. Meats and other proteins Low-fat (lean) proteins, such as fish, chicken, beans, eggs, and tofu. Dairy Low-fat dairy products, such as skim milk, cheese sticks, and yogurt. Beverages Low-fat or fat-free drinks, such as water, low-fat milk, sugar-free drinks, and small amounts of 100% fruit juice. Seasonings and condiments Ketchup. Mustard. Barbecue sauce. Relish. Low-fat or fat-free mayonnaise. Fats and oils Olive oil or canola oil. Walnuts and almonds. The items listed above may not be a complete list of recommended foods and beverages. Contact a dietitian for more options. What foods are  not recommended? Foods that are high in calories or fat. Fried foods. Sweets. Products that are made from refined white flour, including white bread, pastries, white rice, and pasta. The items listed above may not be a complete list of foods and beverages to avoid. Contact a dietitian for more information. Summary  PCOS is a hormonal imbalance that affects a woman's reproductive system.  You can help to manage your PCOS by exercising regularly and eating a healthy, varied diet of vegetables, fruit, whole grains, low-fat (lean) protein, and low-fat dairy products.  Changing what you eat can improve the way that your body  uses insulin, help your hormones reach normal levels, and help you lose weight. This information is not intended to replace advice given to you by your health care provider. Make sure you discuss any questions you have with your health care provider. Document Revised: 04/01/2019 Document Reviewed: 10/14/2017 Elsevier Patient Education  2020 [employer].

## 2020-06-11 LAB — CBC
Hematocrit: 37.6 % (ref 34.0–46.6)
Hemoglobin: 12.5 g/dL (ref 11.1–15.9)
MCH: 28.7 pg (ref 26.6–33.0)
MCHC: 33.2 g/dL (ref 31.5–35.7)
MCV: 86 fL (ref 79–97)
Platelets: 331 10*3/uL (ref 150–450)
RBC: 4.36 x10E6/uL (ref 3.77–5.28)
RDW: 13.6 % (ref 11.7–15.4)
WBC: 6.3 10*3/uL (ref 3.4–10.8)

## 2020-06-11 LAB — COMPREHENSIVE METABOLIC PANEL
ALT: 36 IU/L — ABNORMAL HIGH (ref 0–32)
AST: 25 IU/L (ref 0–40)
Albumin/Globulin Ratio: 1.4 (ref 1.2–2.2)
Albumin: 4.1 g/dL (ref 3.9–5.0)
Alkaline Phosphatase: 123 IU/L — ABNORMAL HIGH (ref 48–121)
BUN/Creatinine Ratio: 12 (ref 9–23)
BUN: 8 mg/dL (ref 6–20)
Bilirubin Total: 0.3 mg/dL (ref 0.0–1.2)
CO2: 22 mmol/L (ref 20–29)
Calcium: 8.7 mg/dL (ref 8.7–10.2)
Chloride: 103 mmol/L (ref 96–106)
Creatinine, Ser: 0.67 mg/dL (ref 0.57–1.00)
GFR calc Af Amer: 140 mL/min/{1.73_m2} (ref >59)
GFR calc non Af Amer: 122 mL/min/{1.73_m2} (ref >59)
Globulin, Total: 3 g/dL (ref 1.5–4.5)
Glucose: 90 mg/dL (ref 65–99)
Potassium: 4.1 mmol/L (ref 3.5–5.2)
Sodium: 138 mmol/L (ref 134–144)
Total Protein: 7.1 g/dL (ref 6.0–8.5)

## 2020-06-11 LAB — TSH: TSH: 3.42 u[IU]/mL (ref 0.450–4.500)

## 2020-06-13 NOTE — Assessment & Plan Note (Addendum)
Patient returning with complaints of irregular menses.  She has been seen for this issue multiple times previously. Given patient's obesity, this is likely adding to her issue. Recommend weight loss which patient is agreeable to. She has never had a pelvic exam previously due to pain and declines pelvic exam/bimanual exam today. I don't think she would tolerate a TVUS at this time. Menstrual irregularity, obesity and acne suspicious for PCOS. Will manage with POPs to see if this helps to regulate her menses. No history of blood clots, non-smoker. Patient instructed to return if she continues to have issues with dyspareunia. Follow up in 3 months. Also obtaining CBC and other metabolic labs today.  Patient should also take PNV given her age. If she plans on conceiving, she should let her medical providers know. She will continue to follow with [place] at [facility] for OB/GYN care as her last OB/GYN was in [place]. Requesting records.

## 2020-06-15 ENCOUNTER — Encounter: Payer: Self-pay | Admitting: Family Medicine

## 2020-08-20 ENCOUNTER — Encounter: Payer: Self-pay | Admitting: Family Medicine

## 2020-08-22 MED ORDER — NORETHINDRONE 0.35 MG PO TABS: 1.0000 | ORAL_TABLET | Freq: Every day | ORAL | 11 refills | Status: AC

## 2022-05-29 ENCOUNTER — Encounter: Payer: Self-pay | Admitting: *Deleted

## 2022-11-13 ENCOUNTER — Inpatient Hospital Stay: Admit: 2022-11-13 | Discharge: 2022-11-13 | Payer: PRIVATE HEALTH INSURANCE

## 2022-12-12 ENCOUNTER — Ambulatory Visit: Admit: 2022-12-12 | Payer: PRIVATE HEALTH INSURANCE | Attending: Reproductive Endocrinology

## 2022-12-12 ENCOUNTER — Encounter: Admit: 2022-12-12 | Payer: PRIVATE HEALTH INSURANCE | Attending: Reproductive Endocrinology

## 2022-12-12 DIAGNOSIS — Q625 Duplication of ureter: Secondary | ICD-10-CM

## 2022-12-12 DIAGNOSIS — N2889 Other specified disorders of kidney and ureter: Secondary | ICD-10-CM

## 2022-12-12 DIAGNOSIS — N970 Female infertility associated with anovulation: Secondary | ICD-10-CM

## 2022-12-12 DIAGNOSIS — E282 Polycystic ovarian syndrome: Secondary | ICD-10-CM

## 2022-12-12 NOTE — Progress Notes
Pt seen in office by Dr. Smith Mince todayWrap up done in personLab slips given to coupleSA info given, TP submittedMRI ordered and contact info given to ptSupplement and vaginal dilators info given to ptCouple to make f/u once workup complete

## 2022-12-12 NOTE — Progress Notes
New Visit Referring ProviderBerrahou, Skeet Latch, [id number] [facility] [place],  [place] [id number]:  Jamie Townsend is a 26 y.o. female who presents for evaluation of infertility lasting  2 years.Notable aspects for today's visit include: 1. 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 The lower pole moiety demonstrates cortical scarring. No suspicious renal lesions, urothelial lesions or calculi. Hepatic steatosis.5. Iron deficiency - On supplement 6. History of vitamin D deficiency -Not taking any supplement7. 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 MedicationsCurrent Outpatient Medications: ?  metFORMIN, 500 mg, Oral, BID WC?  ZyrTEC, Take 10 mg by mouth.?  ferrous sulfate, 1 tablet, Oral, Every Other Day?  hydrocortisone, Apply topically 2 (two) times daily.?  medroxyPROGESTERone, 10 mg, Oral, Daily?  melatonin, Take by mouth.?  tacrolimus, Apply topically 2 (two) times daily.Social History reports that she has never smoked. She has never been exposed to tobacco smoke. She has never used smokeless tobacco. She reports that she does not drink alcohol and does not use drugs.Family History family history includes Breast cancer in her maternal grandmother; Lung cancer in her maternal uncle.GYN History:-Menarche at age 66 - 35 cycle day lengths -No abnormal PAP- Last Pap Smear on 09/2022-No STIs Obstetric HistoryOB History   Gravida 0  Para 0  Term 0  Preterm 0  AB 0  Living 0   SAB 0  IAB 0  Ectopic 0  Molar 0  Multiple 0  Live Births 0     Review of SystemsComprehensive ROS is negative, otherwise as noted in HPIObjectiveBP 107/76  - Pulse 83  - Ht 5' 1 (1.549 m)  - Wt 84.8 kg  - LMP 11/20/2022 (Exact Date)  - SpO2 98%  - BMI 35.33 kg/m?  Exam:Well appearing female in no acute distress. Detailed exam deferred Assessment & Differential Diagnosis26 y.o. G0P0000 with a history of primary  Infertility; ovulatory dysfunction is an apparent contributor/semen parameters not tested/no risk factors for tubal factor. Duplication of LT renal collecting system, insulin resistance, iron and D deficiencies and vaginismus are also relevant to today's visit. Discussion with PatientInfertility-Reviewed relevance of  timely ovulation as well as of tubal patency and normal semen parameters for fertility success. -Discussed meaningfulness of egg reserve testing (AMH) in natural fertility, fertility decline, and ART response-Reassured re excellent fertility prognosis given patient's age -Discussed option of OI/TIC as first line approach provided semen parameters are in normal range discussed; if not successful after 3 attempts, will recommend tubal patency assessment Vaginismus -Discussed the entity/role of unintentional pelvic floor tightening and role of biofeedback/vaginal dilators towards improving vaginal receptivity discussed at length PCOS??- Detailed overview of PCOS including heterogeneity in presentation, diagnostic criteria,  relevance of androgen excess and insulin resistance for pathophysiology and for HPO dysfunction, differential diagnoses that can masquerade as PCOS discussed- Suspect insulin resistance and psychological stress as contributory to the clinical picture ?- Discussed importance of psychological support in addressing stressors that play critical roles in HPO dysfunction and contribute to IRMetabolic-  Reviewed nutritional and lifestyle modification for weight loss/improving insulin signaling -  Discussed consideration for deferring active fertility pursuit until has attained 5-10 lbs weight loss -Recommend seeking consultation with nutritionist Duplicated renal collecting system -Discussed in tandem development of renal collecting and mullerian systems and recommend pelvic/abdominal MRI for better clarification of mullerian anatomy as well as status of previously visualized LT hydroureter At the end of discussion, patient  verbalized an understanding of aspects discussed and wishes to proceed with completion of testing as recommended while prioritizing achieving weight loss in the short term. RecommendationsFertility- Folic Acid 1mg  daily - SA, prenatal labs and Counsyl for partner/reflex for patient - Completion of infertility related labs including Vitamin D, chlamydia antibody screen. Thyroid antibodies - Vit D3, daily  2000 IU-Iron supplement-continue-Quantiferon Gold screen Metabolic - Consider consult with Nutritionist - Ovasitol in addition to metformin-Lifestyle modificationsRenal congenital findings -Pelvic and abdominal MRI to assess mullerian anatomy as well as evidence of hydroureter/nephrosis (if any) Follow up after completion of recommended tests. Scribed for Edwyna Ready, MD by Rae Lips, medical scribe December 20, 2023ATTENDING ATTESTATIONI reviewed and confirmed all material entered and/or pre-charted by the scribe Rae Lips, have made appropriate edits to the documentation which accurately reflects the services that I personally performed and the decisions made by me. 60 min spent in chart review, patient visit, coordination of care and medical record completion.Fredirick Lathe [place]

## 2022-12-17 ENCOUNTER — Encounter: Admit: 2022-12-17 | Payer: PRIVATE HEALTH INSURANCE | Attending: Diagnostic Radiology

## 2023-01-01 ENCOUNTER — Encounter: Admit: 2023-01-01 | Payer: PRIVATE HEALTH INSURANCE | Attending: Reproductive Endocrinology

## 2023-01-14 ENCOUNTER — Encounter: Admit: 2023-01-14 | Payer: PRIVATE HEALTH INSURANCE | Attending: Reproductive Endocrinology

## 2023-01-24 ENCOUNTER — Emergency Department: Admit: 2023-01-24 | Payer: PRIVATE HEALTH INSURANCE

## 2023-01-24 ENCOUNTER — Inpatient Hospital Stay: Admit: 2023-01-24 | Discharge: 2023-01-25 | Payer: PRIVATE HEALTH INSURANCE | Attending: Emergency Medicine

## 2023-01-24 DIAGNOSIS — R519 Headache, unspecified: Secondary | ICD-10-CM

## 2023-01-24 MED ORDER — METOCLOPRAMIDE 5 MG/ML INJECTION SOLUTION
5 mg/mL | INTRAVENOUS | Status: CP
Start: 2023-01-24 — End: ?
  Administered 2023-01-25: 04:00:00 5 mL via INTRAVENOUS

## 2023-01-24 MED ORDER — SODIUM CHLORIDE 0.9 % BOLUS (NEW BAG)
0.9 % | Freq: Once | INTRAVENOUS | Status: CP
Start: 2023-01-24 — End: ?
  Administered 2023-01-25: 04:00:00 0.9 mL/h via INTRAVENOUS

## 2023-01-25 DIAGNOSIS — G44209 Tension-type headache, unspecified, not intractable: Secondary | ICD-10-CM

## 2023-01-25 DIAGNOSIS — Z9109 Other allergy status, other than to drugs and biological substances: Secondary | ICD-10-CM

## 2023-01-25 MED ORDER — IOHEXOL 350 MG IODINE/ML INTRAVENOUS SOLUTION
350 mg iodine/mL | Freq: Once | INTRAVENOUS | Status: CP | PRN
Start: 2023-01-25 — End: ?
  Administered 2023-01-25: 06:00:00 350 mL via INTRAVENOUS

## 2023-01-25 MED ORDER — SODIUM CHLORIDE 0.9 % LARGE VOLUME SYRINGE FOR AUTOINJECTOR
Freq: Once | INTRAVENOUS | Status: CP | PRN
Start: 2023-01-25 — End: ?
  Administered 2023-01-25: 06:00:00 via INTRAVENOUS

## 2023-01-25 NOTE — ED Notes
12:38 AM Pt out of dept via wheelchair to radiology by transporter.

## 2023-01-25 NOTE — Discharge Instructions
Please return to the ED if you develop new or worsening or progressive symptoms including fever higher than 100.4, severe nausea/vomiting, if you are unable to keep yourself hydrated with fluids, have severe abdominal pain, lightheadedness or feel like you are going to pass out, chest pain, difficulty breathing, confusion, visual changes or weakness or numbness in your extremities or any other symptoms that concern you. In addition you should return to the ED if you are unable to adequate outpatient follow-up as directed.  If you were prescribed antibiotics it is very important to take them as prescribed for the entire course (until they are gone) if you start to feel better before they are finished. If you have any narcotics in your system do not operate heavy machinery, drive or care for children or elderly while these medicines are in your system.If you were told to take tylenol for pain you can take 1 tab (325 mg) every 6 hours as needed for pain. Do not take more than 3000 mg in one day. Taking too much tylenol (also known as acetaminophen) can be toxic to your liver and lead to death. Please remember that some medicines contained tylenol in them like percoset or vicodin and should not be combined with tylenol alone.

## 2023-02-01 NOTE — ED Provider Notes
Chief Complaint Patient presents with  Headache- New Onset Or New Symptoms   right sided head pain, frontal. Started on tuesday. took motrin today 30 minutes ago and it is not helping, +nausea. Denies visual changes. No hx of. currently has her menses, started today. Atraumatic. All neuros intact.  HPI/PE:------------------------ED attending note Patient well-appearing 27 year old female with past medical history as below who presents with complaint of abrupt and severe onset headache 2 days ago.  No focal neuro deficits.  Has nausea but no vomiting.  Reports improved with ibuprofen but recurrent daily.  Normal neuro exam.  Has trigger pain at insertion of her trapezius at the base of her skull on the right.  Given acuity of onset and no prior history will obtain CTA head and neck.  Likely will be stable for discharge with symptomatic therapyHani Cornell Barman, MD, MPHED attendingAvailable by MHB---------------------------  Physical ExamED Triage Vitals [01/24/23 2025]BP: 105/68Pulse: 87Pulse from  O2 sat: n/aResp: 18Temp: 98.1 F (36.7 C)Temp src: OralSpO2: 97 % BP 103/71  - Pulse 70  - Temp 97.8 F (36.6 C) (Oral)  - Resp 18  - SpO2 96% Physical Exam ProceduresAttestation/Critical CarePatient Reevaluation: ---------------------------------Pt sx out by Dr. Jeannetta Nap improvedDischarge with outpatient follow Illene Regulus FrenchCritical care provided by attending: no critical carePatient progress: stableClinical Impressions as of 01/25/23 0130 Acute non intractable tension-type headache  ED DispositionDischarge Starleen Blue, MD02/02/24 0036 Roney Marion, MD MPH02/02/24 0130 Roney Marion, MD MPH02/09/24 0000

## 2023-03-01 ENCOUNTER — Encounter: Admit: 2023-03-01 | Payer: PRIVATE HEALTH INSURANCE | Attending: Reproductive Endocrinology

## 2023-03-18 LAB — CHLAMYDIA TRACHOMATIS ABS (IGG, A,M)     (Q YH)
C. TRACHOMATIS IGA: <1:16 {titer}
C. TRACHOMATIS, IGG: <1:64 {titer}
C. TRACHOMATIS, IGM: <1:10 {titer}

## 2023-03-18 LAB — THYROID PEROXIDASE ANTIBODY: THYROID PEROXIDASE ANTIBODIES: 2 [IU]/mL (ref <9)

## 2023-03-18 LAB — C-PEPTIDE: C-PEPTIDE: 2.36 ng/mL (ref 0.80–3.85)

## 2023-03-18 LAB — QUANTIFERON-TB
FOLATE, SERUM: <0 IU/mL — ABNORMAL HIGH (ref <=1)
MITOGEN-NIL (QFTG, 1 TUBE): >10 [IU]/mL
NIL (QFTG, 1 TUBE): 0.07 [IU]/mL
QUANTIFERON-TB GOLD PLUS, 1 TUBE: NEGATIVE
TB2-NIL (QFTG, 1 TUBE): <0 IU/mL (ref <9)
TBIL-NIL (QFTG, 1 TUBE): <0 [IU]/mL

## 2023-03-18 LAB — THYROGLOBULIN ANTIBODY: THYROGLOBULIN AB: 6 [IU]/mL — ABNORMAL HIGH (ref <=1)

## 2023-03-18 LAB — VITAMIN B12/FOLATE, SERUM PANEL     (L Q): VITAMIN B-12: 375 pg/mL (ref 200–1100)

## 2023-03-21 ENCOUNTER — Encounter: Admit: 2023-03-21 | Payer: PRIVATE HEALTH INSURANCE | Attending: Reproductive Endocrinology

## 2023-03-21 ENCOUNTER — Encounter: Admit: 2023-03-21 | Payer: PRIVATE HEALTH INSURANCE

## 2023-03-21 DIAGNOSIS — N970 Female infertility associated with anovulation: Secondary | ICD-10-CM

## 2023-03-21 DIAGNOSIS — Q625 Duplication of ureter: Secondary | ICD-10-CM

## 2023-03-21 MED ORDER — LETROZOLE 2.5 MG TABLET
2.5 mg | ORAL_TABLET | 3 refills | Status: AC
Start: 2023-03-21 — End: ?

## 2023-03-21 NOTE — Progress Notes
MyChart Follow up MRI Pelvic - not completed. Partner FARHAD SYED [birth year]  SA- completed and resulted on 02/14/2024Counsyl - processing PNL completed and resulted on 01/16/2023

## 2023-03-21 NOTE — Progress Notes
Return VisitVIDEO TELEHEALTH VISIT: This clinician is part of the telehealth program and is conducting this visit in a currently approved location. For this visit the clinician and patient were present via interactive audio & video telecommunications system that permits real-time communications, via the [employer].Patient's use of the telehealth platform followed consent and acknowledges agreement to permit telehealth for this visit. State patient is located in: [place]The clinician is appropriately licensed in the above state to provide care for this visit. Other individuals present during the telehealth encounter and their role/relation: spouseBecause this visit was completed over video, a hands-on physical exam was not performed. Patient/parent or guardian understands and knows to call back if condition changes.HPI:   She is a 27 y.o. female, G0P0000 who presents for a return visit to discuss recent results, as well as next steps for managing ovulatory dysfunction towards attempting to conceive.  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 The lower pole moiety demonstrates cortical scarring. -NL renal function Iron deficiency - On supplement   PCOS??-while meeting criteria (oligomenorrhea and elevated free T), the picture is consistent with obesity and IR driving the clinical picture as menses were regular prior to 2021/no signs of hyperandrogenism Patient's last menstrual period was 03/05/2023 (approximate).   Discussion with PatientResults reviewed Component    Latest Ref Rng 01/31/2023  % Sperm Motility     42.00 % 71.20 (E) Sperm Viscosity     0-1+  1+ (E) CONCENTRATION SPERM    16.00 x1000000/mL 69.24 (E) % Norm Sperm Forms    4.000 % 1.000 ! (E) SEMEN ANALYSIS/MIGRATION TEST TIME COLLECTED 9:13AM (E) SEMEN ANALYSIS VOLUME    1.4 ml 3 (E) Tm Analyzed, Smn 9:33AM (E) Semen Analysis/Migration Test Abstinence Period 3 Days (E) Component    Latest Ref Rng 11/08/2022 Ferritin    16 - 154 ng/mL 12 (L)   Component    Latest Ref Rng 03/11/2023 Vitamin B12    200 - 1,100 pg/mL 375  Fertility- Reviewed test results- Options of OI / TIC vs IUI discussed as first line approach   - Discussed pros and cons of options. Discussed likelihood of success rates and 5% risk of multiple pregnancies. Side effects of individual drugs discussed.-Recommend consideration for preconception consultation with MFM given known anomaly of renal collecting system Metabolic-Given IFG, recommend increasing metformin to 1500mg  daily -Recommend consistency with iron supplementAt the end of discussion, patient  verbalized an understanding of aspects discussed and wishes to proceed with trial of OI/TIC. Recommendations &  PlanFertility- Increase  Metformin to 1500 mg  daily (500/1000 regimen)  - Vitamin B12 OTC, 1 mg Folic Acid, Iron supplement x2 a week with orange juice- Abdominal/pelvic MRI scheduled for April - Weight loss regimen encouraged -Preconception consult with MFM -if not pregnant after 3 attempts at OI/TIC, will recommend HSG/SHG and OI/IUI vs IVF. Scribed for Edwyna Ready, MD by Rae Lips, medical scribe March 28, 2024ATTENDING ATTESTATIONI reviewed and confirmed all material entered and/or pre-charted by the scribe, and have made appropriate edits to the documentation which accurately reflects the services that I personally performed and the decisions made by me. 40 min spent in chart review, patient visit, coordination of care and medical record completion.Fredirick Lathe Ilya Neely

## 2023-03-28 ENCOUNTER — Encounter: Admit: 2023-03-28 | Payer: PRIVATE HEALTH INSURANCE | Attending: Reproductive Endocrinology

## 2023-03-28 ENCOUNTER — Encounter: Admit: 2023-03-28 | Payer: PRIVATE HEALTH INSURANCE

## 2023-03-28 DIAGNOSIS — N970 Female infertility associated with anovulation: Secondary | ICD-10-CM

## 2023-04-13 ENCOUNTER — Ambulatory Visit: Admit: 2023-04-13 | Payer: PRIVATE HEALTH INSURANCE

## 2023-05-10 ENCOUNTER — Encounter: Admit: 2023-05-10 | Payer: PRIVATE HEALTH INSURANCE

## 2023-05-27 ENCOUNTER — Encounter: Admit: 2023-05-27 | Payer: PRIVATE HEALTH INSURANCE | Attending: Reproductive Endocrinology

## 2023-05-28 ENCOUNTER — Telehealth: Admit: 2023-05-28 | Payer: PRIVATE HEALTH INSURANCE | Attending: Reproductive Endocrinology

## 2023-05-28 ENCOUNTER — Encounter: Admit: 2023-05-28 | Payer: PRIVATE HEALTH INSURANCE | Attending: Reproductive Endocrinology

## 2023-05-28 DIAGNOSIS — N970 Female infertility associated with anovulation: Secondary | ICD-10-CM

## 2023-05-28 NOTE — Telephone Encounter
Taken care of in separate encounter

## 2023-05-28 NOTE — Telephone Encounter
TC to pt letting her know we will make sure she is comfortable tomorrow at her appointment. Spoke to MV she will use lidocaine jelly for TVUS

## 2023-05-28 NOTE — Telephone Encounter
[facility] MESSAGETime of call:   1:22 PMCaller:   AbihaReason for call:   pt would like to discuss pain management regarding her upcoming transvaginal US tomorrow, please call.Best telephone number for callback:   [phone]  Best time to return call (encourage patient to be available for callback):   anytime Permission to leave message:  yes   [facility]

## 2023-05-29 ENCOUNTER — Encounter: Admit: 2023-05-29 | Payer: PRIVATE HEALTH INSURANCE | Attending: Diagnostic Radiology

## 2023-05-29 ENCOUNTER — Encounter: Admit: 2023-05-29 | Payer: PRIVATE HEALTH INSURANCE

## 2023-05-31 ENCOUNTER — Encounter: Admit: 2023-05-31 | Payer: PRIVATE HEALTH INSURANCE

## 2023-05-31 ENCOUNTER — Encounter: Admit: 2023-05-31 | Payer: PRIVATE HEALTH INSURANCE | Attending: Diagnostic Radiology

## 2023-07-11 ENCOUNTER — Encounter: Admit: 2023-07-11 | Payer: PRIVATE HEALTH INSURANCE | Attending: Reproductive Endocrinology

## 2024-10-30 ENCOUNTER — Ambulatory Visit (INDEPENDENT_AMBULATORY_CARE_PROVIDER_SITE_OTHER): Admitting: Family Medicine

## 2024-10-30 VITALS — BP 90/60 | HR 92 | Ht 62.0 in | Wt 196.2 lb

## 2024-10-30 DIAGNOSIS — R5383 Other fatigue: Secondary | ICD-10-CM

## 2024-10-30 DIAGNOSIS — N942 Vaginismus: Secondary | ICD-10-CM | POA: Diagnosis not present

## 2024-10-30 DIAGNOSIS — E282 Polycystic ovarian syndrome: Secondary | ICD-10-CM

## 2024-10-30 DIAGNOSIS — N979 Female infertility, unspecified: Secondary | ICD-10-CM

## 2024-10-30 LAB — POCT GLYCOSYLATED HEMOGLOBIN (HGB A1C): Hemoglobin A1C: 5.2 % (ref 4.0–5.6)

## 2024-10-30 MED ORDER — METFORMIN HCL ER 500 MG PO TB24
1000.0000 mg | ORAL_TABLET | Freq: Two times a day (BID) | ORAL | 3 refills | Status: AC
Start: 2024-10-30 — End: ?

## 2024-10-30 NOTE — Assessment & Plan Note (Signed)
 Previous workup in [place]  via [facility] and looking to establish with fertility specialist in [place].  Provided with scheduling information for local REI. -Referral to reproductive endocrinology

## 2024-10-30 NOTE — Progress Notes (Signed)
    SUBJECTIVE:   CHIEF COMPLAINT / HPI:   PCOS, primary infertility, vaginismus Already been seeing reproductive endocrinology in the internal medicine department at [facility] previously.  Has had workup for primary infertility, unfortunately still unable to to get pregnant. LMP 09/28/2024, not trying the cycle.  Heavy cycles which have been chronic, takes oral iron supplementation daily.   Previously on metformin for PCOS, reports she did well on it and felt like it helped her lose weight and her cycles to be more regular.  Was on 1000 mg twice daily, has been out x 2 months but would like to restart it.  Also on pelvic floor physical therapy, trying to do home exercises but would like to consider therapy now that she is in [place].  Unable to tolerate speculum exam, will consider Pap smear when able.  PERTINENT  PMH / PSH: PCOS, vaginismus  OBJECTIVE:   BP 90/60   Pulse 92   Ht 5' 2 (1.575 m)   Wt 196 lb 4 oz (89 kg)   LMP 09/28/2024   SpO2 95%   BMI 35.89 kg/m    General: NAD, pleasant, able to participate in exam HEENT: No thyromegaly or thyroid nodules palpated. Cardiac: RRR, no murmurs. Respiratory: CTAB, normal effort, No wheezes, rales or rhonchi Extremities: no edema or cyanosis. Skin: warm and dry, no rashes noted Neuro: alert, no obvious focal deficits Psych: Normal affect and mood  ASSESSMENT/PLAN:   Assessment & Plan Primary female infertility Previous workup in [place]  via [facility] and looking to establish with fertility specialist in [place].  Provided with scheduling information for local REI. -Referral to reproductive endocrinology Vaginismus Referral to pelvic floor PT at patient request.  Unable to tolerate speculum exam, will obtain Pap smear when able. -Referral to pelvic floor PT Fatigue, unspecified type Ongoing for a while, heavy cycles so possible IDA component. -TSH, CBC, ferritin -Vitamin D at patient request PCOS  (polycystic ovarian syndrome) Previously did well on metformin, has been out for the past couple of months since returning to [place]. -Restart metformin 1000 mg twice daily, titrate up over the course of the week as tolerated. -A1c, lipid panel    Dr. Izetta Nap, DO [facility]

## 2024-10-30 NOTE — Assessment & Plan Note (Signed)
 Previously did well on metformin, has been out for the past couple of months since returning to [place]. -Restart metformin 1000 mg twice daily, titrate up over the course of the week as tolerated. -A1c, lipid panel

## 2024-10-30 NOTE — Patient Instructions (Addendum)
 It was wonderful to see you today.  Please bring ALL of your medications with you to every visit.   Today we talked about:  We are checking some blood work today, you will see those results available on MyChart and I will follow up if anything is abnormal.  I also refilled the metformin that you will take 1000 mg twice per day.  Please start by taking at 1000 mg daily the first few days to ensure you do not have too much diarrhea and then increase from there. I included some information for fertility specialist in the area below, the [facility] office has more female providers that would be doing the procedures if that is a preference.  The [facility] fertility location has primarily a female provider with female nurse practitioners. I placed a referral for pelvic floor physical therapy, you can choose if you would like to continue with that based on insurance coverage. When you are ready we can do your Pap smear in the office.  [facility]  [phone]  [facility] - female providers [phone]    We are checking some labs today. If they are abnormal, I will call you. If they are normal, I will send you a MyChart message (if it is active) or a letter in the mail. If you do not hear about your labs in the next 2 weeks, please call the office.  Call the clinic at [phone] if your symptoms worsen or you have any concerns.  Please be sure to schedule follow up at the front desk before you leave today.   Izetta Nap, DO Family Medicine

## 2024-10-30 NOTE — Assessment & Plan Note (Signed)
 Referral to pelvic floor PT at patient request.  Unable to tolerate speculum exam, will obtain Pap smear when able. -Referral to pelvic floor PT

## 2024-10-31 LAB — LIPID PANEL
Chol/HDL Ratio: 4.2 ratio (ref 0.0–4.4)
Cholesterol, Total: 157 mg/dL (ref 100–199)
HDL: 37 mg/dL — ABNORMAL LOW (ref >39)
LDL Chol Calc (NIH): 104 mg/dL — ABNORMAL HIGH (ref 0–99)
Triglycerides: 84 mg/dL (ref 0–149)
VLDL Cholesterol Cal: 16 mg/dL (ref 5–40)

## 2024-10-31 LAB — CBC
Hematocrit: 42.3 % (ref 34.0–46.6)
Hemoglobin: 13.9 g/dL (ref 11.1–15.9)
MCH: 30 pg (ref 26.6–33.0)
MCHC: 32.9 g/dL (ref 31.5–35.7)
MCV: 91 fL (ref 79–97)
Platelets: 332 x10E3/uL (ref 150–450)
RBC: 4.63 x10E6/uL (ref 3.77–5.28)
RDW: 13.1 % (ref 11.7–15.4)
WBC: 8.8 x10E3/uL (ref 3.4–10.8)

## 2024-10-31 LAB — VITAMIN D 25 HYDROXY (VIT D DEFICIENCY, FRACTURES): Vit D, 25-Hydroxy: 13.2 ng/mL — ABNORMAL LOW (ref 30.0–100.0)

## 2024-10-31 LAB — FERRITIN: Ferritin: 28 ng/mL (ref 15–150)

## 2024-10-31 LAB — TSH: TSH: 1.77 u[IU]/mL (ref 0.450–4.500)

## 2024-11-04 ENCOUNTER — Ambulatory Visit: Payer: Self-pay | Admitting: Family Medicine

## 2024-11-04 DIAGNOSIS — N921 Excessive and frequent menstruation with irregular cycle: Secondary | ICD-10-CM

## 2025-01-08 NOTE — Therapy (Unsigned)
 " OUTPATIENT PHYSICAL THERAPY FEMALE PELVIC EVALUATION   Patient Name: Sierra Jennings MRN: [id number] DOB:Apr 05, 1996, 29 y.o., female Today's Date: 01/08/2025  END OF SESSION:   Past Medical History:  Diagnosis Date   Allergic rhinitis    Depressed mood    Eczema    No past surgical history on file. 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  Rationale for Evaluation and Treatment: Rehabilitation  ONSET DATE: ***  SUBJECTIVE:                                                                                                                                                                                           SUBJECTIVE STATEMENT: Unable to tolerate speculum exam  Fluid intake:   FUNCTIONAL LIMITATIONS: ***  PERTINENT HISTORY:  Medications for current condition: *** Surgeries: none Other: *** Sexual abuse: {Yes/No:304960894}  PAIN:  Are you having pain? {yes/no:20286} NPRS scale: ***/10 Pain location: {pelvic pain location:27098}  Pain type: {type:313116} Pain description: {PAIN DESCRIPTION:21022940}   Aggravating factors: *** Relieving factors: ***  PRECAUTIONS: None  RED FLAGS: {PT Red Flags:29287}   WEIGHT BEARING RESTRICTIONS: No  FALLS:  Has patient fallen in last 6 months? {fallsyesno:27318}  OCCUPATION: ***  ACTIVITY LEVEL : ***  PLOF: {PLOF:24004}  PATIENT GOALS: ***   BOWEL MOVEMENT: Pain with bowel movement: {yes/no:20286} Type of bowel movement:{PT BM type:27100} Fully empty rectum:  {No/Yes:304960894} Leakage: {Yes/No:304960894}                                                  Caused by: *** Bowel urgency: *** Pads: {Yes/No:304960894} Fiber supplement/laxative {YES/NO AS:20300}  URINATION: Pain with urination: {yes/no:20286} Fully empty bladder: {Yes/No:304960894}***                                         Post-void dribble: {YES/NO AS:20300} Stream: {PT urination:27102} Urgency: {YES/NO AS:20300} Frequency:during the day ***  Nocturia: {Yes/No:304960894}***   Leakage: {PT leakage:27103} Pads/briefs: {Yes/No:304960894}  INTERCOURSE:  Ability to have vaginal penetration {YES/NO:21197} Pain with intercourse: {pain with intercourse PA:27099} Dryness: {YES/NO AS:20300} Climax: *** Marinoff Scale: ***/3 Lubricant:  PREGNANCY: Vaginal deliveries *** Tearing {Yes***/No:304960894} Episiotomy {YES/NO AS:20300} C-section deliveries *** Currently pregnant {Yes***/No:304960894}  PROLAPSE: {PT prolapse:27101}   OBJECTIVE:  Note: Objective measures were completed at Evaluation unless otherwise noted.  DIAGNOSTIC FINDINGS:  Post-void residual: Voiding Cystourethrogram (VCUG):  Ultrasound: ***  PATIENT SURVEYS:  {rehab surveys:24030}  PFIQ-7: *** UIQ-7 *** CRAIG -7 *** POPIQ-7 *** Female Sexual Function Index (FSFI) Questionnaire ***  COGNITION: Overall cognitive status: {cognition:24006}     SENSATION: Light touch: {intact/deficits:24005}  LUMBAR SPECIAL TESTS:  {lumbar special test:25242}  FUNCTIONAL TESTS:  {Functional tests:24029} Single leg stance:  Rt:  Lt: Sit-up test: Squat: Bed mobility:  GAIT: Assistive device utilized: {Assistive devices:23999} Comments: ***  POSTURE: {posture:25561}   LUMBARAROM/PROM:  A/PROM A/PROM  Eval (% available)  Flexion   Extension   Right lateral flexion   Left lateral flexion   Right rotation   Left rotation    (Blank rows = not  tested)  LOWER EXTREMITY ROM:  {AROM/PROM:27142} ROM Right eval Left eval  Hip flexion    Hip extension    Hip abduction    Hip adduction    Hip internal rotation    Hip external rotation    Knee flexion    Knee extension    Ankle dorsiflexion    Ankle plantarflexion    Ankle inversion    Ankle eversion     (Blank rows = not tested)  LOWER EXTREMITY MMT:  MMT Right eval Left eval  Hip flexion    Hip extension    Hip abduction    Hip adduction    Hip internal rotation    Hip external rotation    Knee flexion    Knee extension    Ankle dorsiflexion    Ankle plantarflexion    Ankle inversion    Ankle eversion     (Blank rows = not tested) PALPATION:  General: ***  Pelvic Alignment: ***  Abdominal: ***  Diastasis: {Yes/No:304960894}*** Distortion: {YES/NO AS:20300}  Breathing: *** Scar tissue: {Yes/No:304960894}*** Active Straight Leg Raise: ***                External Perineal Exam: ***                             Internal Pelvic Floor: ***  Patient confirms identification and approves PT to assess internal pelvic floor and treatment {yes/no:20286} All internal or external pelvic floor assessments and/or treatments are completed with proper hand hygiene and gloves hands. If needed gloves are changed with hand hygiene during patient care time.  PELVIC MMT:   MMT eval  Vaginal   Internal Anal Sphincter   External Anal Sphincter   Puborectalis   (Blank rows = not tested)        TONE: ***  PROLAPSE: ***  TODAY'S TREATMENT:  DATE: ***  EVAL ***   PATIENT EDUCATION:  Education details: *** Person educated: {Person educated:25204} Education method: {Education Method:25205} Education comprehension: {Education Comprehension:25206}  HOME EXERCISE PROGRAM: ***  ASSESSMENT:  CLINICAL IMPRESSION: Patient is a *** y.o.  *** who was seen today for physical therapy evaluation and treatment for ***.   OBJECTIVE IMPAIRMENTS: {opptimpairments:25111}.   ACTIVITY LIMITATIONS: {activitylimitations:27494}  PARTICIPATION LIMITATIONS: {participationrestrictions:25113}  PERSONAL FACTORS: {Personal factors:25162} are also affecting patient's functional outcome.   REHAB POTENTIAL: {rehabpotential:25112}  CLINICAL DECISION MAKING: {clinical decision making:25114}  EVALUATION COMPLEXITY: {Evaluation complexity:25115}   GOALS: Goals reviewed with patient? {yes/no:20286}  SHORT TERM GOALS: Target date: ***  *** Baseline: Goal status: INITIAL  2.  *** Baseline:  Goal status: INITIAL  3.  *** Baseline:  Goal status: INITIAL  4.  *** Baseline:  Goal status: INITIAL  5.  *** Baseline:  Goal status: INITIAL  6.  *** Baseline:  Goal status: INITIAL  LONG TERM GOALS: Target date: ***  *** Baseline:  Goal status: INITIAL  2.  *** Baseline:  Goal status: INITIAL  3.  *** Baseline:  Goal status: INITIAL  4.  *** Baseline:  Goal status: INITIAL  5.  *** Baseline:  Goal status: INITIAL  6.  *** Baseline:  Goal status: INITIAL  PLAN:  PT FREQUENCY: {rehab frequency:25116}  PT DURATION: {rehab duration:25117}  PLANNED INTERVENTIONS: {rehab planned interventions:25118::97110-Therapeutic exercises,97530- Therapeutic [phone]- Neuromuscular re-education,97535- Self Rjmz,02859- Manual therapy,Patient/Family education}  PLAN FOR NEXT SESSION: ***   Dustyn Armbrister, PT 01/08/2025, 12:04 PM  "

## 2025-01-11 ENCOUNTER — Ambulatory Visit: Attending: Family Medicine | Admitting: Physical Therapy
